# Patient Record
Sex: Female | Born: 2000 | Hispanic: Yes | Marital: Single | Smoking: Never smoker
Health system: Southern US, Community
[De-identification: ages and names within clinical notes are randomized; demographics above are authoritative.]

## PROBLEM LIST (undated history)

## (undated) DIAGNOSIS — Z789 Other specified health status: Secondary | ICD-10-CM

## (undated) HISTORY — DX: Other specified health status: Z78.9

## (undated) HISTORY — PX: WISDOM TOOTH EXTRACTION: SHX21

## (undated) HISTORY — PX: NO PAST SURGERIES: SHX2092

---
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
## 2018-09-19 ENCOUNTER — Ambulatory Visit (INDEPENDENT_AMBULATORY_CARE_PROVIDER_SITE_OTHER): Payer: BC Managed Care – PPO | Admitting: Certified Nurse Midwife

## 2018-09-19 ENCOUNTER — Other Ambulatory Visit: Payer: Self-pay

## 2018-09-19 ENCOUNTER — Other Ambulatory Visit (HOSPITAL_COMMUNITY)
Admission: RE | Admit: 2018-09-19 | Discharge: 2018-09-19 | Disposition: A | Discharge status: Home / Self Care | Payer: BC Managed Care – PPO | Source: Ambulatory Visit | Attending: Certified Nurse Midwife | Admitting: Certified Nurse Midwife

## 2018-09-19 ENCOUNTER — Encounter: Payer: Self-pay | Admitting: Certified Nurse Midwife

## 2018-09-19 VITALS — BP 112/62 | HR 71 | Ht 64.0 in | Wt 135.6 lb

## 2018-09-19 DIAGNOSIS — N912 Amenorrhea, unspecified: Secondary | ICD-10-CM

## 2018-09-19 DIAGNOSIS — Z3009 Encounter for other general counseling and advice on contraception: Secondary | ICD-10-CM | POA: Insufficient documentation

## 2018-09-19 DIAGNOSIS — Z202 Contact with and (suspected) exposure to infections with a predominantly sexual mode of transmission: Secondary | ICD-10-CM

## 2018-09-19 DIAGNOSIS — Z3201 Encounter for pregnancy test, result positive: Secondary | ICD-10-CM

## 2018-09-19 LAB — POCT URINE PREGNANCY: Preg Test, Ur: POSITIVE — AB

## 2018-09-19 NOTE — Progress Notes (Signed)
GYN ENCOUNTER NOTE  Subjective:       Kristina Maynard is a 18 y.o. G2P0000 female is here for gynecologic evaluation of the following issues:  1. Possible STD exposure.  She state she had a new partner that she did not use condoms with a couple of times and that he admitted that he had an STD. She also noticed bumps on her bottom that are not painful.    Gynecologic History Patient's last menstrual period was 09/01/2018 (exact date). Contraception: condoms Last Pap: n/a .  Last mammogram: n/a   Obstetric History OB History  Gravida Para Term Preterm AB Living  2 0 0 0 1 0  SAB TAB Ectopic Multiple Live Births  1 0 0 0 0    # Outcome Date GA Lbr Len/2nd Weight Sex Delivery Anes PTL Lv  2 Current           1 SAB 2019            History reviewed. No pertinent past medical history.  Past Surgical History:  Procedure Laterality Date  . NO PAST SURGERIES      No current outpatient medications on file prior to visit.   No current facility-administered medications on file prior to visit.     No Known Allergies  Social History   Socioeconomic History  . Marital status: Single    Spouse name: Not on file  . Number of children: Not on file  . Years of education: Not on file  . Highest education level: Not on file  Occupational History  . Not on file  Social Needs  . Financial resource strain: Not on file  . Food insecurity    Worry: Not on file    Inability: Not on file  . Transportation needs    Medical: Not on file    Non-medical: Not on file  Tobacco Use  . Smoking status: Never Smoker  . Smokeless tobacco: Never Used  Substance and Sexual Activity  . Alcohol use: No  . Drug use: No  . Sexual activity: Yes    Birth control/protection: Condom  Lifestyle  . Physical activity    Days per week: Not on file    Minutes per session: Not on file  . Stress: Not on file  Relationships  . Social Herbalist on phone: Not on file    Gets together: Not on  file    Attends religious service: Not on file    Active member of club or organization: Not on file    Attends meetings of clubs or organizations: Not on file    Relationship status: Not on file  . Intimate partner violence    Fear of current or ex partner: Not on file    Emotionally abused: Not on file    Physically abused: Not on file    Forced sexual activity: Not on file  Other Topics Concern  . Not on file  Social History Narrative  . Not on file    Family History  Problem Relation Age of Onset  . Diabetes Maternal Grandmother   . Breast cancer Neg Hx   . Ovarian cancer Neg Hx   . Colon cancer Neg Hx   . Heart disease Neg Hx     The following portions of the patient's history were reviewed and updated as appropriate: allergies, current medications, past family history, past medical history, past social history, past surgical history and problem list.  Review of  Systems Review of Systems - Negative except as mentioned in HPI Review of Systems - General ROS: negative for - chills, fatigue, fever, hot flashes, malaise or night sweats Hematological and Lymphatic ROS: negative for - bleeding problems or swollen lymph nodes Gastrointestinal ROS: negative for - abdominal pain, blood in stools, change in bowel habits and nausea/vomiting Musculoskeletal ROS: negative for - joint pain, muscle pain or muscular weakness Genito-Urinary ROS: negative for - change in menstrual cycle, dysmenorrhea, dyspareunia, dysuria, genital discharge, genital ulcers, hematuria, incontinence, irregular/heavy menses, nocturia or pelvic pain. Positive for bumps  Objective:   BP 112/62   Pulse 71   Ht 5\' 4"  (1.626 m)   Wt 135 lb 9.6 oz (61.5 kg)   LMP 09/01/2018 (Exact Date)   BMI 23.28 kg/m  CONSTITUTIONAL: Well-developed, well-nourished female in no acute distress.  HENT:  Normocephalic, atraumatic.  NECK: Normal range of motion, supple, no masses.  Normal thyroid.  SKIN: Skin is warm and dry.  No rash noted. Not diaphoretic. No erythema. No pallor. NEUROLGIC: Alert and oriented to person, place, and time. PSYCHIATRIC: Normal mood and affect. Normal behavior. Normal judgment and thought content. CARDIOVASCULAR:Not Examined RESPIRATORY: Not Examined BREASTS: Not Examined ABDOMEN: Soft, non distended; Non tender.  No Organomegaly. PELVIC:  External Genitalia: Normal  BUS: Normal  Vagina: Normal, white discharge no odor  Cervix: Normal  Uterus: Normal size, shape,consistency, mobile  Adnexa: Normal  RV: Normal   Bladder: Nontender Genital warts noted on near anus and buttocks  MUSCULOSKELETAL: Normal range of motion. No tenderness.  No cyanosis, clubbing, or edema.  UPT :positive   Assessment:   Exposure STD Positive Urine pregnancy test  Plan:  Discussed genital warts. STD testing today. Reviewed positive urine pregnancy test. Discussed options. Information given " [facility] Women's Choice" . Discussed abortion, adoptions, and continuing of pregnancy. Pt encouraged to make contact with a women's choice as soon as possible if considering abortion. Ultrasound ordered for dating. Follow up prn if she plans to keep pregnancy.   Doreene BurkeAnnie Brette Cast, CNM

## 2018-09-19 NOTE — Patient Instructions (Signed)

## 2018-09-23 ENCOUNTER — Other Ambulatory Visit: Payer: BC Managed Care – PPO

## 2018-09-23 ENCOUNTER — Other Ambulatory Visit: Payer: Self-pay

## 2018-09-23 ENCOUNTER — Ambulatory Visit (INDEPENDENT_AMBULATORY_CARE_PROVIDER_SITE_OTHER): Payer: BC Managed Care – PPO

## 2018-09-23 DIAGNOSIS — N912 Amenorrhea, unspecified: Secondary | ICD-10-CM

## 2018-09-23 DIAGNOSIS — Z3687 Encounter for antenatal screening for uncertain dates: Secondary | ICD-10-CM

## 2018-09-23 DIAGNOSIS — Z3201 Encounter for pregnancy test, result positive: Secondary | ICD-10-CM

## 2018-09-23 DIAGNOSIS — Z202 Contact with and (suspected) exposure to infections with a predominantly sexual mode of transmission: Secondary | ICD-10-CM

## 2018-09-23 DIAGNOSIS — Z113 Encounter for screening for infections with a predominantly sexual mode of transmission: Secondary | ICD-10-CM

## 2018-09-23 LAB — CERVICOVAGINAL ANCILLARY ONLY
Chlamydia: NEGATIVE
Neisseria Gonorrhea: NEGATIVE
Trichomonas: NEGATIVE

## 2018-09-23 NOTE — Progress Notes (Signed)
Lab orders placed.  

## 2018-09-24 LAB — BETA HCG QUANT (REF LAB): hCG Quant: 16 m[IU]/mL

## 2018-12-08 ENCOUNTER — Other Ambulatory Visit: Payer: Self-pay

## 2018-12-08 DIAGNOSIS — Z20822 Contact with and (suspected) exposure to covid-19: Secondary | ICD-10-CM

## 2018-12-09 LAB — NOVEL CORONAVIRUS, NAA: SARS-CoV-2, NAA: NOT DETECTED

## 2019-07-31 IMAGING — US US OB COMP LESS 14 WK
1 series · 13 of 28 positions shown · non-contrast
Comparison: None.

CLINICAL DATA: Vaginal pain and bleeding. Beta hCG [DATE].
Gestational age by last menstrual period 8 weeks.

EXAM:
OBSTETRIC <14 WK US AND TRANSVAGINAL OB US
TECHNIQUE: Both transabdominal and transvaginal ultrasound examinations were
performed for complete evaluation of the gestation as well as the
maternal uterus, adnexal regions, and pelvic cul-de-sac.
Transvaginal technique was performed to assess early pregnancy.

[Series 1: us ob comp less 14 wk · 13 of 97 slices shown]
[im 4/97]
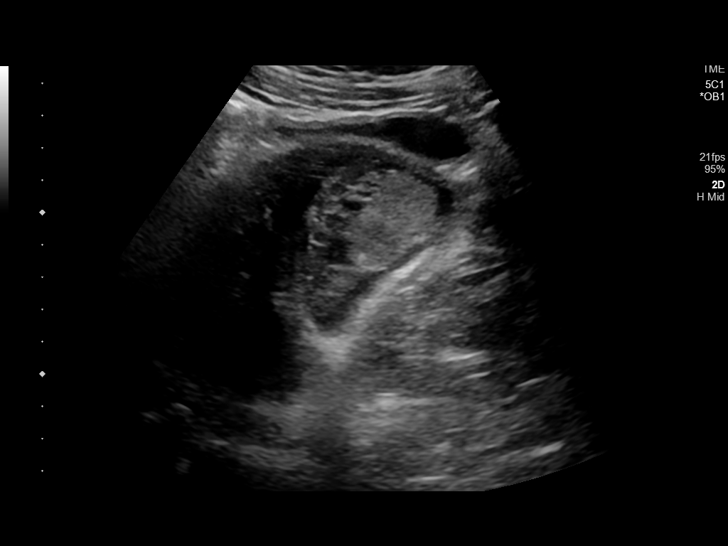
[im 11/97]
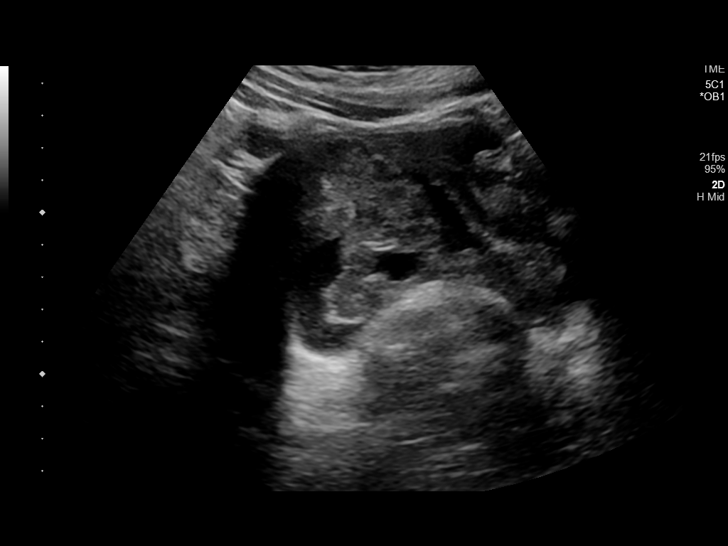
[im 18/97]
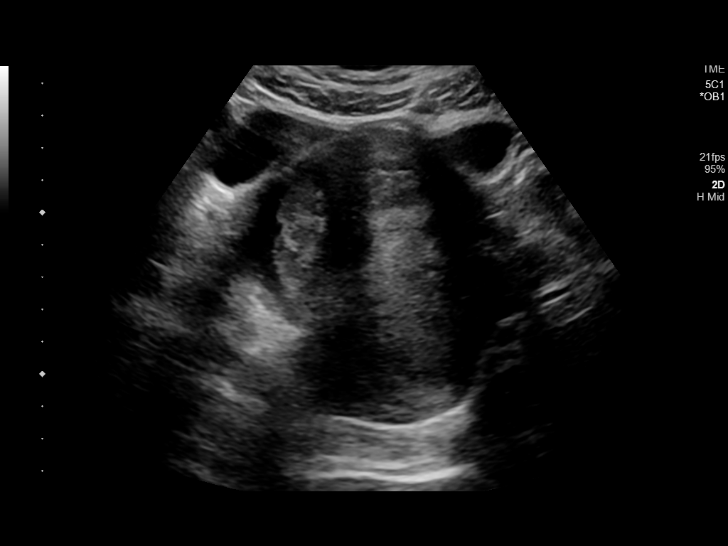
[im 25/97]
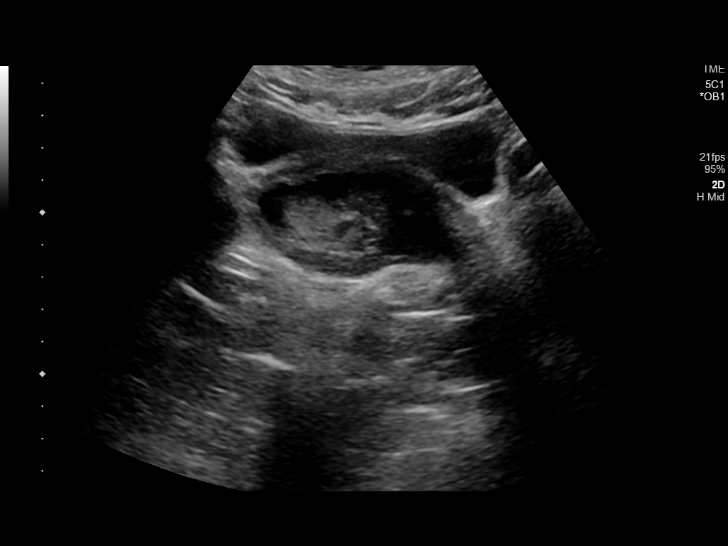
[im 33/97]
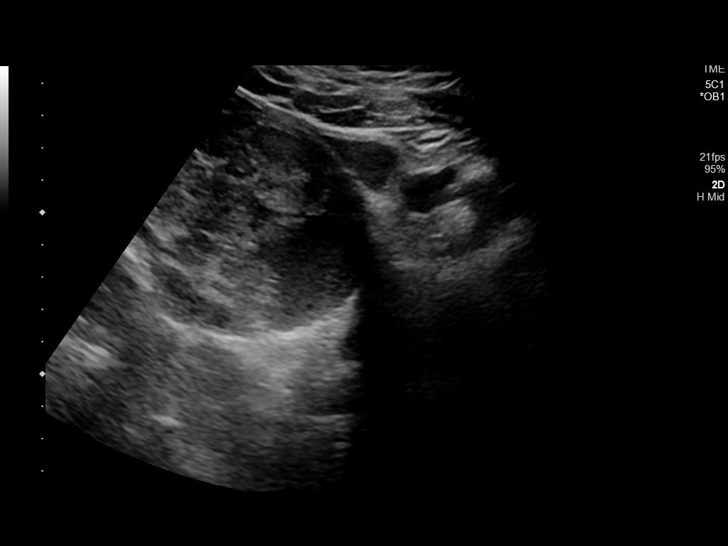
[im 40/97]
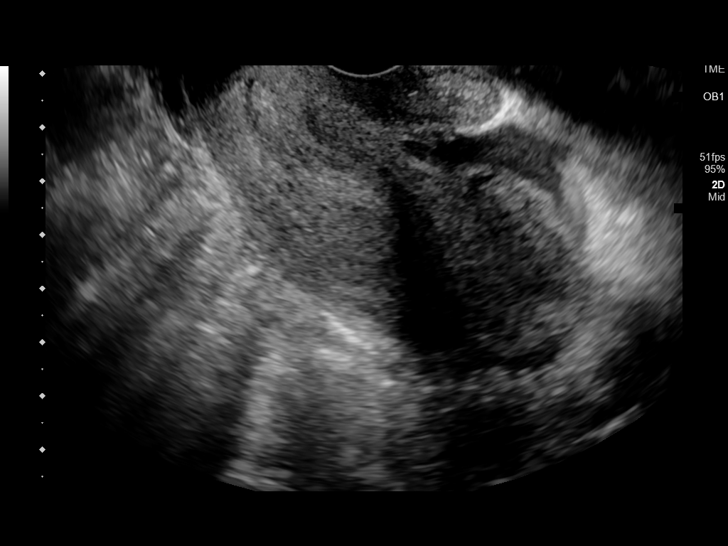
[im 50/97]
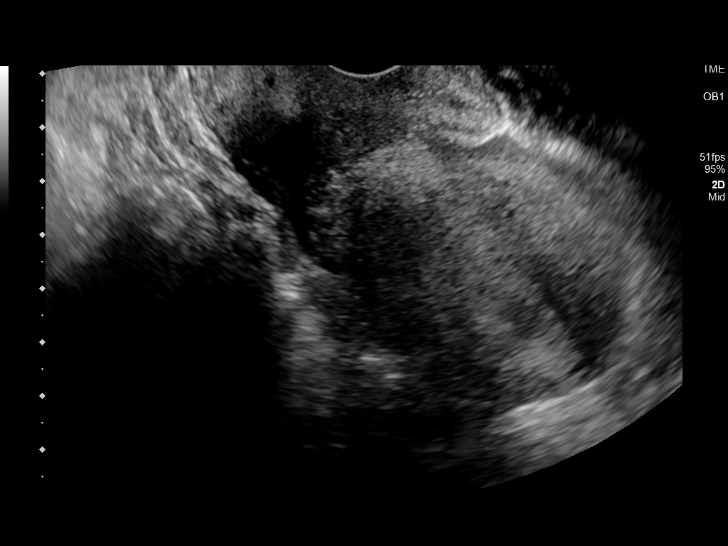
[im 57/97]
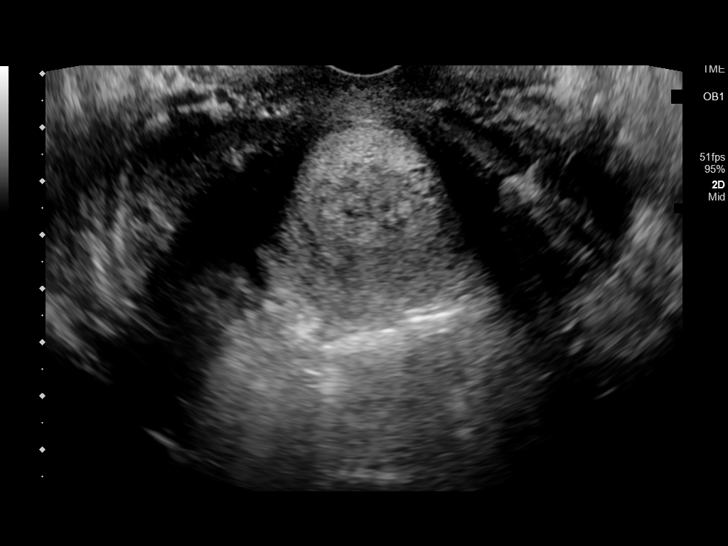
[im 65/97]
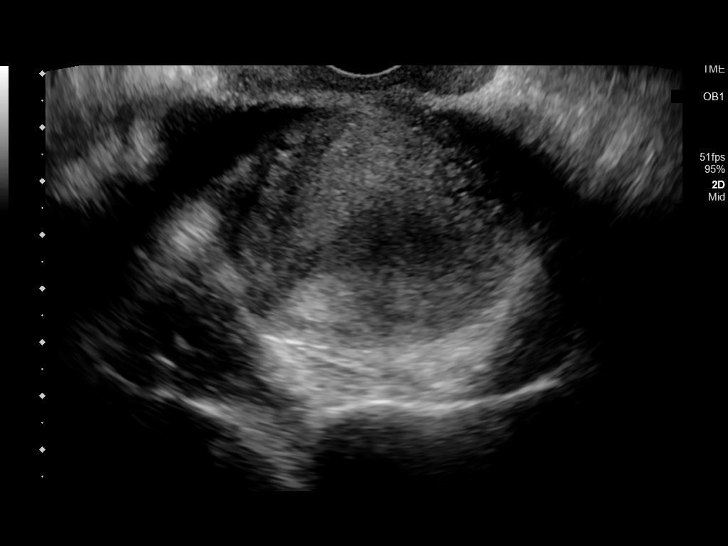
[im 72/97]
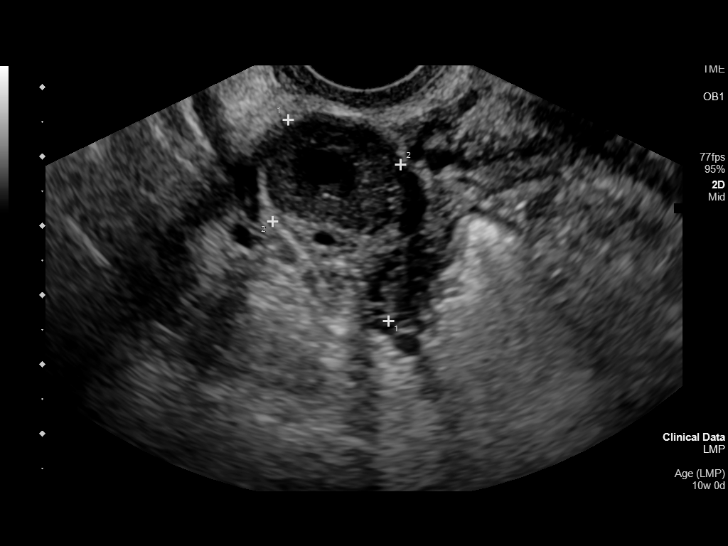
[im 79/97]
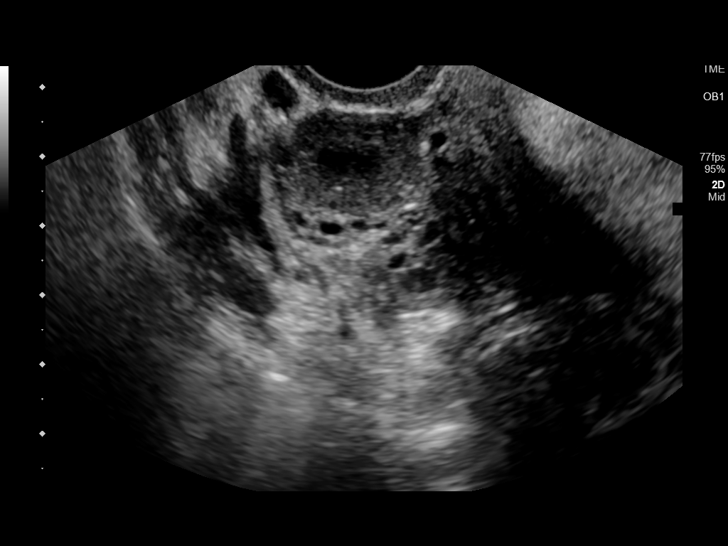
[im 86/97]
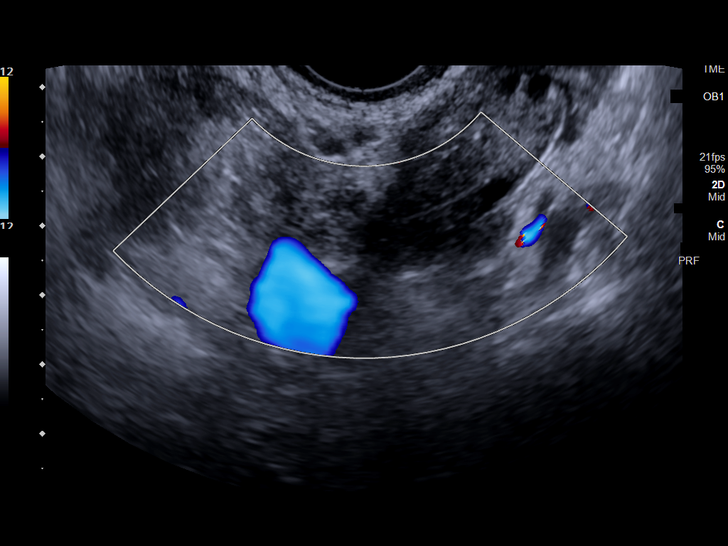
[im 93/97]
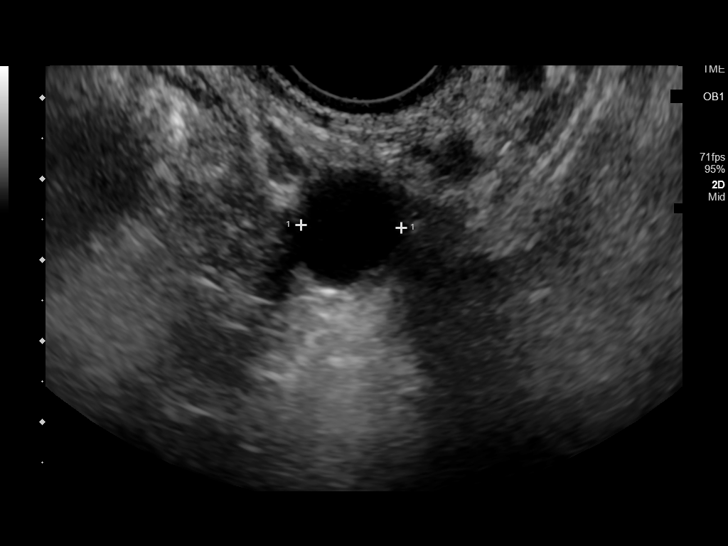

[13 of 28 positions shown; findings below may reference images not displayed]

FINDINGS: Intrauterine gestational sac: Not present.

Yolk sac:  Not present.

Embryo:  Not present.

Cardiac Activity: Not applicable.

Subchorionic hemorrhage:  None visualized.

Maternal uterus/adnexae: Large amount of complex debris lower
uterine segment on transabdominal imaging no longer present after
voiding. Small amount of echogenic debris at the lower uterine
segment on transvaginal imaging. 2.1 cm intra-ovarian thick-walled
corpus luteal cyst on the RIGHT. Trace free fluid in pelvic
cul-de-sac.
IMPRESSION: Large amount of complex intrauterine debris on transabdominal
ultrasound, markedly decreased on post void imaging. No
sonographically identified IUP. Recommend follow-up US and serial
beta HCG in 10-14 days for definitive diagnosis and to exclude
retained products of conception. This recommendation follows SRU
consensus guidelines: Diagnostic Criteria for Nonviable Pregnancy
Early in the First Trimester. N Engl J Med 2514; [DATE].

## 2020-10-21 ENCOUNTER — Other Ambulatory Visit: Payer: Self-pay

## 2020-10-21 ENCOUNTER — Encounter: Payer: Self-pay | Admitting: Advanced Practice Midwife

## 2020-10-21 ENCOUNTER — Other Ambulatory Visit (HOSPITAL_COMMUNITY)
Admission: RE | Admit: 2020-10-21 | Discharge: 2020-10-21 | Disposition: A | Discharge status: Home / Self Care | Payer: BC Managed Care – PPO | Source: Ambulatory Visit | Attending: Advanced Practice Midwife | Admitting: Advanced Practice Midwife

## 2020-10-21 ENCOUNTER — Ambulatory Visit (INDEPENDENT_AMBULATORY_CARE_PROVIDER_SITE_OTHER): Payer: BC Managed Care – PPO | Admitting: Advanced Practice Midwife

## 2020-10-21 VITALS — BP 102/60 | Ht 63.0 in | Wt 137.0 lb

## 2020-10-21 DIAGNOSIS — Z113 Encounter for screening for infections with a predominantly sexual mode of transmission: Secondary | ICD-10-CM | POA: Diagnosis not present

## 2020-10-21 DIAGNOSIS — Z01419 Encounter for gynecological examination (general) (routine) without abnormal findings: Secondary | ICD-10-CM | POA: Insufficient documentation

## 2020-10-21 DIAGNOSIS — Z124 Encounter for screening for malignant neoplasm of cervix: Secondary | ICD-10-CM | POA: Insufficient documentation

## 2020-10-21 DIAGNOSIS — Z23 Encounter for immunization: Secondary | ICD-10-CM | POA: Diagnosis not present

## 2020-10-21 DIAGNOSIS — Z1159 Encounter for screening for other viral diseases: Secondary | ICD-10-CM

## 2020-10-21 DIAGNOSIS — N898 Other specified noninflammatory disorders of vagina: Secondary | ICD-10-CM | POA: Diagnosis present

## 2020-10-21 NOTE — Progress Notes (Signed)
Gynecology Annual Exam  PCP: Pediatrics, [facility]  Chief Complaint:  Chief Complaint  Patient presents with   Gynecologic Exam   Contraception    Interested in IUD   Injections    Flu shot    History of Present Illness: Patient is a 20 y.o. G2P0020 presents for annual exam. The patient has complaint today of concern for STD due to former partner cheating on her. She has thick white discharge with a slight itch and some irritation as symptoms. She denies odor. She requests testing for STDs. She would like to start birth control and is most interested in the copper IUD. We discussed the differences between the hormonal and the copper IUDs. She is 19 and accepts cervical cancer screening today.    LMP: Patient's last menstrual period was 09/28/2020 (exact date). Menarche:11 Average Interval: regular, 28 days Duration of flow:  4-5  days Heavy Menses: no Clots: no Intermenstrual Bleeding: no Postcoital Bleeding: no Dysmenorrhea: no  The patient is not currently sexually active. She has used condoms inconsistently for contraception. She denies dyspareunia.  The patient does perform self breast exams.  There is no notable family history of breast or ovarian cancer in her family.  The patient wears seatbelts: yes.  The patient has regular exercise:  she works out at [employer] 2 hours 4 days per week, she admits healthy diet, adequate hydration and sleep .    The patient denies current symptoms of depression.    Review of Systems: Review of Systems  Constitutional:  Negative for chills and fever.  HENT:  Negative for congestion, ear discharge, ear pain, hearing loss, sinus pain and sore throat.   Eyes:  Negative for blurred vision and double vision.  Respiratory:  Negative for cough, shortness of breath and wheezing.   Cardiovascular:  Negative for chest pain, palpitations and leg swelling.  Gastrointestinal:  Negative for abdominal pain, blood in stool, constipation, diarrhea,  heartburn, melena, nausea and vomiting.  Genitourinary:  Negative for dysuria, flank pain, frequency, hematuria and urgency.       Positive for vaginal discharge, itch, irritation  Musculoskeletal:  Negative for back pain, joint pain and myalgias.  Skin:  Negative for itching and rash.  Neurological:  Negative for dizziness, tingling, tremors, sensory change, speech change, focal weakness, seizures, loss of consciousness, weakness and headaches.  Endo/Heme/Allergies:  Negative for environmental allergies. Does not bruise/bleed easily.  Psychiatric/Behavioral:  Negative for depression, hallucinations, memory loss, substance abuse and suicidal ideas. The patient is not nervous/anxious and does not have insomnia.    Past Medical History:  There are no problems to display for this patient.   Past Surgical History:  Past Surgical History:  Procedure Laterality Date   WISDOM TOOTH EXTRACTION      Gynecologic History:  Patient's last menstrual period was 09/28/2020 (exact date). Last Pap: First PAP today  Obstetric History: G2P0020  Family History:  Family History  Problem Relation Age of Onset   Diabetes Maternal Grandmother    Breast cancer Neg Hx    Ovarian cancer Neg Hx    Colon cancer Neg Hx    Heart disease Neg Hx     Social History:  Social History   Socioeconomic History   Marital status: Single    Spouse name: Not on file   Number of children: Not on file   Years of education: Not on file   Highest education level: Not on file  Occupational History   Not  on file  Tobacco Use   Smoking status: Never   Smokeless tobacco: Never  Vaping Use   Vaping Use: Former  Substance and Sexual Activity   Alcohol use: Not Currently   Drug use: No   Sexual activity: Not Currently    Birth control/protection: None  Other Topics Concern   Not on file  Social History Narrative   Not on file   Social Determinants of Health   Financial Resource Strain: Not on file  Food  Insecurity: Not on file  Transportation Needs: Not on file  Physical Activity: Not on file  Stress: Not on file  Social Connections: Not on file  Intimate Partner Violence: Not on file    Allergies:  No Known Allergies  Medications: Prior to Admission medications   Not on File    Physical Exam Vitals: Blood pressure 102/60, height 5\' 3"  (1.6 m), weight 137 lb (62.1 kg), last menstrual period 09/28/2020  General: NAD HEENT: normocephalic, anicteric Thyroid: no enlargement, no palpable nodules Pulmonary: No increased work of breathing, CTAB Cardiovascular: RRR, distal pulses 2+ Breast: Breast symmetrical, no tenderness, no palpable nodules or masses, no skin or nipple retraction present, no nipple discharge.  No axillary or supraclavicular lymphadenopathy. Abdomen: NABS, soft, non-tender, non-distended.  Umbilicus without lesions.  No hepatomegaly, splenomegaly or masses palpable. No evidence of hernia  Genitourinary:  External: Normal external female genitalia.  Normal urethral meatus, normal Bartholin's and Skene's glands.    Vagina: Normal vaginal mucosa, no evidence of prolapse.    Cervix: Grossly normal in appearance, no bleeding, no CMT  Uterus: Non-enlarged, mobile, normal contour.    Adnexa: ovaries non-enlarged, no adnexal masses  Rectal: deferred  Lymphatic: no evidence of inguinal lymphadenopathy Extremities: no edema, erythema, or tenderness Neurologic: Grossly intact Psychiatric: mood appropriate, affect full   Assessment: 20 y.o. G2P0020 routine annual exam  Plan: Problem List Items Addressed This Visit   None Visit Diagnoses     Well woman exam with routine gynecological exam    -  Primary   Relevant Orders   RPR Qual   HIV Antibody (routine testing w rflx)   Hepatitis C antibody   Hepatitis B surface antibody,qualitative   Cervicovaginal ancillary only   Cytology - PAP   Need for immunization against influenza       Relevant Orders   Flu Vaccine  QUAD 33mo+IM (Fluarix, Fluzone & Alfiuria Quad PF) (Completed)   Screen for sexually transmitted diseases       Relevant Orders   RPR Qual   HIV Antibody (routine testing w rflx)   Hepatitis C antibody   Hepatitis B surface antibody,qualitative   Cytology - PAP   Screening for cervical cancer       Relevant Orders   Cytology - PAP   Vaginal discharge       Relevant Orders   Cervicovaginal ancillary only   Vaginal itching       Relevant Orders   Cervicovaginal ancillary only   Need for hepatitis B screening test       Relevant Orders   Hepatitis B surface antibody,qualitative   Need for hepatitis C screening test       Relevant Orders   Hepatitis C antibody       1) 4) Gardasil Series discussed and if applicable offered to patient - Patient has not previously completed 3 shot series   2) STI screening  was offered and accepted  3)  ASCCP guidelines and rationale discussed.  Patient  opts for  beginning today followed by every 3 years  screening interval  4) Contraception - the patient is currently using  none.  She is interested in changing to Paragard IUD  We discussed safe sex practices to reduce her furture risk of STI's.    5) Return for call when period starts for Paragard insertion.   Tresea Mall, CNM [facility] 10/21/2020, 10:11 AM

## 2020-10-22 LAB — HIV ANTIBODY (ROUTINE TESTING W REFLEX): HIV Screen 4th Generation wRfx: NONREACTIVE

## 2020-10-22 LAB — HEPATITIS C ANTIBODY: Hep C Virus Ab: <0.1 s/co ratio (ref 0.0–0.9)

## 2020-10-22 LAB — RPR QUALITATIVE: RPR Ser Ql: NONREACTIVE

## 2020-10-22 LAB — HEPATITIS B SURFACE ANTIBODY,QUALITATIVE: Hep B Surface Ab, Qual: REACTIVE

## 2020-10-26 LAB — CERVICOVAGINAL ANCILLARY ONLY
Bacterial Vaginitis (gardnerella): NEGATIVE
Candida Glabrata: NEGATIVE
Candida Vaginitis: POSITIVE — AB
Comment: NEGATIVE
Comment: NEGATIVE
Comment: NEGATIVE

## 2020-10-28 ENCOUNTER — Other Ambulatory Visit: Payer: Self-pay | Admitting: Advanced Practice Midwife

## 2020-10-28 ENCOUNTER — Telehealth: Payer: Self-pay | Admitting: Advanced Practice Midwife

## 2020-10-28 DIAGNOSIS — B3731 Acute candidiasis of vulva and vagina: Secondary | ICD-10-CM

## 2020-10-28 DIAGNOSIS — B373 Candidiasis of vulva and vagina: Secondary | ICD-10-CM

## 2020-10-28 MED ORDER — FLUCONAZOLE 150 MG PO TABS: 150.0000 mg | ORAL_TABLET | Freq: Once | ORAL | 3 refills | Status: AC

## 2020-10-28 NOTE — Telephone Encounter (Signed)
This pt is scheduled for the [facility] insertion with JEG on Monday, 9/12 at 11:10.

## 2020-10-28 NOTE — Progress Notes (Signed)
Rx diflucan and message to patient regarding yeast infection.

## 2020-10-31 ENCOUNTER — Other Ambulatory Visit: Payer: Self-pay

## 2020-10-31 ENCOUNTER — Ambulatory Visit (INDEPENDENT_AMBULATORY_CARE_PROVIDER_SITE_OTHER): Payer: BC Managed Care – PPO | Admitting: Advanced Practice Midwife

## 2020-10-31 ENCOUNTER — Encounter: Payer: Self-pay | Admitting: Advanced Practice Midwife

## 2020-10-31 VITALS — BP 100/64 | HR 76 | Ht 63.0 in | Wt 135.0 lb

## 2020-10-31 DIAGNOSIS — Z30430 Encounter for insertion of intrauterine contraceptive device: Secondary | ICD-10-CM | POA: Diagnosis not present

## 2020-10-31 NOTE — Progress Notes (Signed)
   GYNECOLOGY OFFICE PROCEDURE NOTE  Kristina Maynard is a 20 y.o. G2P0020 here for [facility] IUD insertion. No GYN concerns.  Last pap smear was on 10/21/2020 and results pending.  The patient is currently using condoms for contraception and her LMP is Patient's last menstrual period was 10/28/2020 (exact date)..  The indication for her IUD is contraception/cycle control.  Review of Systems  Constitutional:  Negative for chills and fever.  HENT:  Negative for congestion, ear discharge, ear pain, hearing loss, sinus pain and sore throat.   Eyes:  Negative for blurred vision and double vision.  Respiratory:  Negative for cough, shortness of breath and wheezing.   Cardiovascular:  Negative for chest pain, palpitations and leg swelling.  Gastrointestinal:  Negative for abdominal pain, blood in stool, constipation, diarrhea, heartburn, melena, nausea and vomiting.  Genitourinary:  Negative for dysuria, flank pain, frequency, hematuria and urgency.  Musculoskeletal:  Negative for back pain, joint pain and myalgias.  Skin:  Negative for itching and rash.  Neurological:  Negative for dizziness, tingling, tremors, sensory change, speech change, focal weakness, seizures, loss of consciousness, weakness and headaches.  Endo/Heme/Allergies:  Negative for environmental allergies. Does not bruise/bleed easily.  Psychiatric/Behavioral:  Negative for depression, hallucinations, memory loss, substance abuse and suicidal ideas. The patient is not nervous/anxious and does not have insomnia.     Vital Signs: BP 100/64 (Cuff Size: Normal)   Pulse 76   Ht 5\' 3"  (1.6 m)   Wt 135 lb (61.2 kg)   LMP 10/28/2020 (Exact Date)   BMI 23.91 kg/m  Constitutional: Well nourished, well developed female in no acute distress.  HEENT: normal Skin: Warm and dry.  Respiratory: Clear to auscultation bilateral. Normal respiratory effort Neuro: DTRs 2+, Cranial nerves grossly intact Psych: Alert and Oriented x3. No memory  deficits. Normal mood and affect.   Pelvic exam: (female chaperone present) is not limited by body habitus EGBUS: within normal limits Vagina: within normal limits and with normal mucosa, no blood in the vault Cervix: grossly normal appearance   IUD Insertion Procedure Note Patient identified, informed consent performed, consent signed.   Discussed risks of irregular bleeding, cramping, infection, malpositioning, expulsion or uterine perforation of the IUD (1:1000 placements)  which may require further procedure such as laparoscopy.  IUD while effective at preventing pregnancy do not prevent transmission of sexually transmitted diseases and use of barrier methods for this purpose was discussed. Time out was performed.  Urine pregnancy test negative.  Speculum placed in the vagina.  Cervix visualized.  Cleaned with Betadine x 2.  Grasped posteriorly with a single tooth tenaculum.  Uterus sounded to 6 cm. IUD placed per manufacturer's recommendations.  Strings trimmed to 2 cm. Tenaculum was removed, good hemostasis noted.  Patient tolerated procedure well.   Patient was given post-procedure instructions.  She was advised to have backup contraception for one week.  Patient was also asked to check IUD strings periodically and follow up in 4-6 weeks for IUD check.  IUD insertion CPT 58300,  Skyla [id number] Mirena [id number] Liletta [id number] Paraguard [id number] 12/28/2020 [phone] Modifer 25, plus Modifer 79 is done during a global billing visit    [id number], CNM [facility]  10/31/2020, 3:00 PM

## 2020-10-31 NOTE — Telephone Encounter (Signed)
Kristina Maynard reserved for this patient. 

## 2020-11-01 LAB — CYTOLOGY - PAP
Chlamydia: POSITIVE — AB
Comment: NEGATIVE
Comment: NEGATIVE
Comment: NORMAL
Neisseria Gonorrhea: NEGATIVE
Trichomonas: NEGATIVE

## 2020-11-02 ENCOUNTER — Other Ambulatory Visit: Payer: Self-pay | Admitting: Advanced Practice Midwife

## 2020-11-02 DIAGNOSIS — A749 Chlamydial infection, unspecified: Secondary | ICD-10-CM

## 2020-11-02 MED ORDER — AZITHROMYCIN 500 MG PO TABS: 1000.0000 mg | ORAL_TABLET | Freq: Once | ORAL | 0 refills | Status: AC

## 2020-11-02 NOTE — Progress Notes (Signed)
Rx sent to treat CT, communicable dx report sent, message to patient.

## 2020-11-16 ENCOUNTER — Ambulatory Visit: Payer: BC Managed Care – PPO | Admitting: Obstetrics and Gynecology

## 2020-11-16 ENCOUNTER — Telehealth: Payer: Self-pay | Admitting: Obstetrics and Gynecology

## 2020-11-16 NOTE — Telephone Encounter (Signed)
Prior to patient arriving in clinic I called her to discuss her Pap smear result of LGSIL.  Based on ASCCP guidelines, the recommended treatment is to have her repeat her Pap smear in 1 year.  Therefore, a colposcopy is not indicated for this result.  She states that this is the only Pap smear that she has ever had and I can find no other Pap smear results in our system.  Therefore, she is at low risk for developing severe dysplasia.  I did reiterate to her the importance of following up with a Pap smear in 1 year to make sure this resolves.  She voiced understanding of this.  We discussed this is the way that cervical cancer is prevented and she voiced understanding of this.

## 2020-11-23 NOTE — Telephone Encounter (Signed)
Kyleena rcvd/charged 10/31/20

## 2020-11-30 ENCOUNTER — Ambulatory Visit: Payer: BC Managed Care – PPO | Admitting: Advanced Practice Midwife

## 2020-12-16 ENCOUNTER — Ambulatory Visit (INDEPENDENT_AMBULATORY_CARE_PROVIDER_SITE_OTHER): Payer: BC Managed Care – PPO | Admitting: Advanced Practice Midwife

## 2020-12-16 ENCOUNTER — Other Ambulatory Visit: Payer: Self-pay

## 2020-12-16 ENCOUNTER — Encounter: Payer: Self-pay | Admitting: Advanced Practice Midwife

## 2020-12-16 VITALS — BP 110/70 | Ht 63.0 in | Wt 132.0 lb

## 2020-12-16 DIAGNOSIS — Z30431 Encounter for routine checking of intrauterine contraceptive device: Secondary | ICD-10-CM | POA: Diagnosis not present

## 2020-12-16 NOTE — Progress Notes (Signed)
Obstetrics & Gynecology Office Visit   Chief Complaint:  Chief Complaint  Patient presents with   IUD check    No concerns    History of Present Illness: 20 y.o. patient presenting for follow up of Kyleena IUD placement  6 weeks  ago.  The indication for her IUD was contraception.  She denies any complications since her IUD placement.  Still having some occasional spotting.  She has not tried to feel strings.    Review of Systems: Review of Systems  Constitutional:  Negative for chills and fever.  HENT:  Negative for congestion, ear discharge, ear pain, hearing loss, sinus pain and sore throat.   Eyes:  Negative for blurred vision and double vision.  Respiratory:  Negative for cough, shortness of breath and wheezing.   Cardiovascular:  Negative for chest pain, palpitations and leg swelling.  Gastrointestinal:  Negative for abdominal pain, blood in stool, constipation, diarrhea, heartburn, melena, nausea and vomiting.  Genitourinary:  Negative for dysuria, flank pain, frequency, hematuria and urgency.  Musculoskeletal:  Negative for back pain, joint pain and myalgias.  Skin:  Negative for itching and rash.  Neurological:  Negative for dizziness, tingling, tremors, sensory change, speech change, focal weakness, seizures, loss of consciousness, weakness and headaches.  Endo/Heme/Allergies:  Negative for environmental allergies. Does not bruise/bleed easily.  Psychiatric/Behavioral:  Negative for depression, hallucinations, memory loss, substance abuse and suicidal ideas. The patient is not nervous/anxious and does not have insomnia.    Past Medical History:  Past Medical History:  Diagnosis Date   No pertinent past medical history     Past Surgical History:  Past Surgical History:  Procedure Laterality Date   WISDOM TOOTH EXTRACTION      Gynecologic History: No LMP recorded. (Menstrual status: IUD).  Obstetric History: G2P0020  Family History:  Family History  Problem  Relation Age of Onset   Diabetes Maternal Grandmother    Breast cancer Neg Hx    Ovarian cancer Neg Hx    Colon cancer Neg Hx    Heart disease Neg Hx     Social History:  Social History   Socioeconomic History   Marital status: Single    Spouse name: Not on file   Number of children: Not on file   Years of education: Not on file   Highest education level: Not on file  Occupational History   Not on file  Tobacco Use   Smoking status: Never   Smokeless tobacco: Never  Vaping Use   Vaping Use: Former  Substance and Sexual Activity   Alcohol use: Not Currently   Drug use: No   Sexual activity: Not Currently    Birth control/protection: I.U.D.    Comment: Kyleena  Other Topics Concern   Not on file  Social History Narrative   Not on file   Social Determinants of Health   Financial Resource Strain: Not on file  Food Insecurity: Not on file  Transportation Needs: Not on file  Physical Activity: Not on file  Stress: Not on file  Social Connections: Not on file  Intimate Partner Violence: Not on file    Allergies:  No Known Allergies  Medications: Prior to Admission medications   Medication Sig Start Date End Date Taking? Authorizing Provider  levonorgestrel (KYLEENA) 19.5 MG IUD 1 each by Intrauterine route once. 10/31/20 10/31/25 Yes [provider]    Physical Exam Blood pressure 110/70, height 5\' 3"  (1.6 m), weight 132 lb (59.9 kg). No LMP  recorded. (Menstrual status: IUD).  General: NAD HEENT: normocephalic, anicteric Pulmonary: No increased work of breathing  Genitourinary:  External: Normal external female genitalia.  Normal urethral meatus, normal  Bartholin's and Skene's glands.    Vagina: Normal vaginal mucosa, no evidence of prolapse.    Cervix: Grossly normal in appearance, no bleeding, IUD strings visualized 1.5 cm  Uterus: Non-enlarged, mobile, normal contour.  No CMT  Adnexa: ovaries non-enlarged, no adnexal masses  Rectal:  deferred  Lymphatic: no evidence of inguinal lymphadenopathy Extremities: no edema, erythema, or tenderness Neurologic: Grossly intact Psychiatric: mood appropriate, affect full    Assessment: 20 y.o. G2P0020 IUD string check   Plan: Problem List Items Addressed This Visit   None Visit Diagnoses     IUD check up    -  Primary        1.  The patient was given instructions to check her IUD strings monthly and call with any problems or concerns.  She should call for fevers, chills, abnormal vaginal discharge, pelvic pain, or other complaints.  2.   IUDs while effective at preventing pregnancy do not prevent transmission of sexually transmitted diseases and use of barrier methods for this purpose was discussed.  Low overall incidence of failure with 99.7% efficacy rate in typical use.  The patient has not contraindication to IUD placement.  3.  She will return for a annual exam in 1 year.  All questions answered.  4) A total of 15 minutes were spent in face-to-face contact with the patient during this encounter with over half of that time devoted to counseling and coordination of care.  5) Return if symptoms worsen or fail to improve.   Tresea Mall, CNM [facility] 12/16/2020, 2:02 PM

## 2021-09-11 ENCOUNTER — Ambulatory Visit
Admission: EM | Admit: 2021-09-11 | Discharge: 2021-09-11 | Disposition: A | Discharge status: Home / Self Care | Payer: BC Managed Care – PPO | Attending: Emergency Medicine | Admitting: Emergency Medicine

## 2021-09-11 DIAGNOSIS — S0502XA Injury of conjunctiva and corneal abrasion without foreign body, left eye, initial encounter: Secondary | ICD-10-CM

## 2021-09-11 MED ORDER — FLUORESCEIN SODIUM 1 MG OP STRP: 1.0000 | ORAL_STRIP | Freq: Once | OPHTHALMIC | Status: DC

## 2021-09-11 MED ORDER — TOBRAMYCIN-DEXAMETHASONE 0.3-0.1 % OP SUSP: 2.0000 [drp] | Freq: Four times a day (QID) | OPHTHALMIC | 0 refills | Status: AC

## 2021-09-11 MED ORDER — TETRACAINE HCL 0.5 % OP SOLN: 2.0000 [drp] | Freq: Once | OPHTHALMIC | Status: DC

## 2021-09-11 NOTE — ED Provider Notes (Signed)
[facility]    CSN: [id number] Arrival date & time: 09/11/21  1923      History   Chief Complaint Chief Complaint  Patient presents with   Eye Problem    left    HPI Kristina Maynard is a 21 y.o. female.   HPI  21 year old female here for evaluation of eye complaint.  Patient reports that she was washing her dog with a medicated shampoo yesterday at around 5:00 when some of the water and soap got in her eye.  She states that she did not flush her eye right away but did so several hours later.  This morning when she woke up she had some yellow crusting on her eyelashes but her eye was not matted shut and she states that she is having some mild intermittent blurry vision.  She states that she does have pain that she moves her eye left right or when she closes her eyelid.  Past Medical History:  Diagnosis Date   No pertinent past medical history     Patient Active Problem List   Diagnosis Date Noted   Chlamydia infection 11/02/2020    Past Surgical History:  Procedure Laterality Date   WISDOM TOOTH EXTRACTION      OB History     Gravida  2   Para  0   Term  0   Preterm  0   AB  2   Living  0      SAB  1   IAB  1   Ectopic  0   Multiple  0   Live Births  0            Home Medications    Prior to Admission medications   Medication Sig Start Date End Date Taking? Authorizing Provider  levonorgestrel (KYLEENA) 19.5 MG IUD 1 each by Intrauterine route once. 10/31/20 10/31/25 Yes [provider]  tobramycin-dexamethasone [facility]) ophthalmic solution Place 2 drops into the left eye every 6 (six) hours. 09/11/21  Yes Becky Augusta, NP    Family History Family History  Problem Relation Age of Onset   Diabetes Maternal Grandmother    Breast cancer Neg Hx    Ovarian cancer Neg Hx    Colon cancer Neg Hx    Heart disease Neg Hx     Social History Social History   Tobacco Use   Smoking status: Never   Smokeless tobacco:  Never  Vaping Use   Vaping Use: Former  Substance Use Topics   Alcohol use: Not Currently   Drug use: No     Allergies   Patient has no known allergies.   Review of Systems Review of Systems  Eyes:  Positive for pain, discharge and visual disturbance. Negative for photophobia, redness and itching.     Physical Exam Triage Vital Signs ED Triage Vitals [09/11/21 2028]  Enc Vitals Group     BP      Pulse      Resp      Temp      Temp src      SpO2      Weight 145 lb (65.8 kg)     Height 5\' 4"  (1.626 m)     Head Circumference      Peak Flow      Pain Score 4     Pain Loc      Pain Edu?      Excl. in [place]?    No data found.  Updated Vital Signs BP 110/68 (BP Location: Left Arm)   Pulse 76   Temp 98.6 F (37 C) (Oral)   Ht 5\' 4"  (1.626 m)   Wt 145 lb (65.8 kg)   SpO2 100%   BMI 24.89 kg/m   Visual Acuity Right Eye Distance: 20/30 corrected Left Eye Distance: 20/20 corrected Bilateral Distance: 20/20 corrected  Right Eye Near:   Left Eye Near:    Bilateral Near:     Physical Exam Vitals and nursing note reviewed.  Constitutional:      Appearance: Normal appearance. She is not ill-appearing.  HENT:     Head: Normocephalic and atraumatic.  Eyes:     General: No scleral icterus.    Extraocular Movements: Extraocular movements intact.     Conjunctiva/sclera: Conjunctivae normal.     Pupils: Pupils are equal, round, and reactive to light.  Skin:    General: Skin is warm and dry.     Capillary Refill: Capillary refill takes less than 2 seconds.  Neurological:     General: No focal deficit present.     Mental Status: She is alert and oriented to person, place, and time.      UC Treatments / Results  Labs (all labs ordered are listed, but only abnormal results are displayed) Labs Reviewed - No data to display  EKG   Radiology No results found.  Procedures Procedures (including critical care time)  Medications Ordered in UC Medications   tetracaine (PONTOCAINE) 0.5 % ophthalmic solution 2 drop (has no administration in time range)  fluorescein ophthalmic strip 1 strip (has no administration in time range)    Initial Impression / Assessment and Plan / UC Course  I have reviewed the triage vital signs and the nursing notes.  Pertinent labs & imaging results that were available during my care of the patient were reviewed by me and considered in my medical decision making (see chart for details).  Patient is a very pleasant, nontoxic-appearing 21 year old female here for evaluation of left eye irritation and yellow crusty discharge after getting medicated dog shampoo in her eye yesterday at around 5 PM.  This is 26 hours post incident.  Patient reports pain with EOM and also with blinking.  On exam patient does have mild edema but no erythema, fluctuance, or induration of the upper eyelid.  The bulbar and labral conjunctiva are unremarkable.  I did instill 2 drops of tetracaine in the left eye followed by fluorescein dye and examined the patient's eye under Woods lamp.  There is a small, superficial, corneal abrasion in the center of the iris and pupil.  There is no other foreign bodies or lesions noted.  I did evert the upper eyelid to look for foreign bodies and it was negative.  I suspect the patient sustained a corneal abrasion as a result of the chemical exposure.  I will treat her with TobraDex 2 drops 4 times a day in the left eye for 5 days.  If her symptoms do not improve, or they worsen, she should follow-up with her eye doctor.   Final Clinical Impressions(s) / UC Diagnoses   Final diagnoses:  Abrasion of left cornea, initial encounter     Discharge Instructions      Instill 2 drops of TobraDex in your left eye 4 times a day for 5 days.  Abstain from wearing contacts until your symptoms have completely resolved.  When you do resume wearing contacts start with a fresh pair.  Make sure that  you are storing your  contracts in a clean with clean saline nightly.  Wash your contacts with an enzymatic cleaner once weekly and rinse them thoroughly with new, clean contact lens solution to remove any enzymatic residue.  If you have any worsening of your symptoms such as increased pain, increased redness, increased photosensitivity, or changes in your vision you need to follow-up with your eye doctor.      ED Prescriptions     Medication Sig Dispense Auth. Provider   tobramycin-dexamethasone [facility]) ophthalmic solution Place 2 drops into the left eye every 6 (six) hours. 5 mL Becky Augusta, NP      PDMP not reviewed this encounter.   Becky Augusta, NP 09/11/21 2044

## 2021-09-11 NOTE — ED Triage Notes (Signed)
Patient states She was washing her dog with a medicated wash and some got in her left eye.   Patient reports pain when she blinks.

## 2021-09-11 NOTE — Discharge Instructions (Signed)
Instill 2 drops of TobraDex in your left eye 4 times a day for 5 days.  Abstain from wearing contacts until your symptoms have completely resolved.  When you do resume wearing contacts start with a fresh pair.  Make sure that you are storing your contracts in a clean with clean saline nightly.  Wash your contacts with an enzymatic cleaner once weekly and rinse them thoroughly with new, clean contact lens solution to remove any enzymatic residue.  If you have any worsening of your symptoms such as increased pain, increased redness, increased photosensitivity, or changes in your vision you need to follow-up with your eye doctor.

## 2022-03-03 ENCOUNTER — Emergency Department: Payer: BC Managed Care – PPO

## 2022-03-03 ENCOUNTER — Emergency Department
Admission: EM | Admit: 2022-03-03 | Discharge: 2022-03-03 | Disposition: A | Discharge status: Home / Self Care | Payer: BC Managed Care – PPO | Attending: Emergency Medicine | Admitting: Emergency Medicine

## 2022-03-03 ENCOUNTER — Other Ambulatory Visit: Payer: Self-pay

## 2022-03-03 DIAGNOSIS — S0083XA Contusion of other part of head, initial encounter: Secondary | ICD-10-CM | POA: Insufficient documentation

## 2022-03-03 DIAGNOSIS — S0992XA Unspecified injury of nose, initial encounter: Secondary | ICD-10-CM | POA: Diagnosis present

## 2022-03-03 DIAGNOSIS — S022XXA Fracture of nasal bones, initial encounter for closed fracture: Secondary | ICD-10-CM | POA: Insufficient documentation

## 2022-03-03 DIAGNOSIS — Y92410 Unspecified street and highway as the place of occurrence of the external cause: Secondary | ICD-10-CM | POA: Diagnosis not present

## 2022-03-03 LAB — POC URINE PREG, ED: Preg Test, Ur: NEGATIVE

## 2022-03-03 MED ORDER — ONDANSETRON 4 MG PO TBDP
4.0000 mg | ORAL_TABLET | Freq: Once | ORAL | Status: AC
  Administered 2022-03-03: 4 mg via ORAL
  Filled 2022-03-03: qty 1
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
